# Patient Record
Sex: Female | Born: 1965
Health system: Southern US, Community
[De-identification: ages and names within clinical notes are randomized; demographics above are authoritative.]

## PROBLEM LIST (undated history)

## (undated) DIAGNOSIS — E669 Obesity, unspecified: Secondary | ICD-10-CM

## (undated) DIAGNOSIS — K649 Unspecified hemorrhoids: Secondary | ICD-10-CM

## (undated) DIAGNOSIS — I10 Essential (primary) hypertension: Secondary | ICD-10-CM

## (undated) DIAGNOSIS — G43909 Migraine, unspecified, not intractable, without status migrainosus: Secondary | ICD-10-CM

## (undated) HISTORY — DX: Essential (primary) hypertension: I10

## (undated) HISTORY — DX: Migraine, unspecified, not intractable, without status migrainosus: G43.909

## (undated) HISTORY — DX: Obesity, unspecified: E66.9

## (undated) HISTORY — DX: Unspecified hemorrhoids: K64.9

---

## 2016-07-05 HISTORY — PX: BREAST BIOPSY: SHX20

## 2019-02-06 DIAGNOSIS — Z7189 Other specified counseling: Secondary | ICD-10-CM | POA: Diagnosis not present

## 2019-02-06 DIAGNOSIS — Z20828 Contact with and (suspected) exposure to other viral communicable diseases: Secondary | ICD-10-CM | POA: Diagnosis not present

## 2019-03-29 DIAGNOSIS — Z23 Encounter for immunization: Secondary | ICD-10-CM | POA: Diagnosis not present

## 2019-04-20 DIAGNOSIS — Z7189 Other specified counseling: Secondary | ICD-10-CM | POA: Diagnosis not present

## 2019-04-20 DIAGNOSIS — Z20828 Contact with and (suspected) exposure to other viral communicable diseases: Secondary | ICD-10-CM | POA: Diagnosis not present

## 2019-04-21 DIAGNOSIS — Z03818 Encounter for observation for suspected exposure to other biological agents ruled out: Secondary | ICD-10-CM | POA: Diagnosis not present

## 2019-05-11 DIAGNOSIS — Z20828 Contact with and (suspected) exposure to other viral communicable diseases: Secondary | ICD-10-CM | POA: Diagnosis not present

## 2019-05-11 DIAGNOSIS — Z7189 Other specified counseling: Secondary | ICD-10-CM | POA: Diagnosis not present

## 2019-09-07 ENCOUNTER — Other Ambulatory Visit: Payer: Self-pay | Admitting: Surgery

## 2019-09-18 ENCOUNTER — Ambulatory Visit (HOSPITAL_COMMUNITY)
Admission: RE | Admit: 2019-09-18 | Discharge: 2019-09-18 | Disposition: A | Discharge status: Home / Self Care | Payer: BC Managed Care – PPO | Source: Ambulatory Visit | Attending: Surgery | Admitting: Surgery

## 2019-09-18 ENCOUNTER — Other Ambulatory Visit: Payer: Self-pay

## 2019-09-18 DIAGNOSIS — Z9884 Bariatric surgery status: Secondary | ICD-10-CM | POA: Diagnosis not present

## 2019-09-18 DIAGNOSIS — Z01818 Encounter for other preprocedural examination: Secondary | ICD-10-CM | POA: Diagnosis not present

## 2019-10-04 ENCOUNTER — Other Ambulatory Visit: Payer: Self-pay

## 2019-10-04 ENCOUNTER — Encounter: Payer: Self-pay | Admitting: Dietician

## 2019-10-04 ENCOUNTER — Encounter: Payer: BC Managed Care – PPO | Attending: Surgery | Admitting: Dietician

## 2019-10-04 DIAGNOSIS — E669 Obesity, unspecified: Secondary | ICD-10-CM

## 2019-10-04 NOTE — Patient Instructions (Signed)
Begin working through the [employer] discussed today.

## 2019-10-04 NOTE — Progress Notes (Signed)
Nutrition Assessment for Bariatric Surgery Medical Nutrition Therapy   Patient was seen on 10/04/2019 for Pre-Operative Nutrition Assessment. Letter of approval faxed to [facility] Surgery bariatric surgery program coordinator on 10/04/2019.   Referral stated Supervised Weight Loss (SWL) visits needed: 0  Planned surgery: Sleeve Gastrectomy  Pt expectation of surgery: to be thinner, feeling more like self again, looking forward to clothes shopping, more confidence, more energy    NUTRITION ASSESSMENT   Anthropometrics  Start weight at [facility]: 265 lbs (date: 10/04/2019) Height: 68 in BMI: 40 kg/m2     Lifestyle & Dietary Hx Typical meal pattern is 2 meals per day plus snacks, usually skips lunch. Avoids sodas, sugar, and red meat. Typically eats fish, chicken, [place] or pork with salad or vegetables. May snack on protein bars, baked chips, or rice cakes.   24-Hr Dietary Recall First Meal: breakfast sandwich (keto bread + cheese + egg + lean ham)   Snack: baked chips  Second Meal: - Snack: rice cakes Third Meal: tilapia + salad  Snack: - Beverages: water, sugar-free sparkling water, coffee (3 cups/day)   NUTRITION DIAGNOSIS  Overweight/obesity ([place]-3.3) related to past poor dietary habits and physical inactivity as evidenced by patient w/ planned Sleeve Gastrectomy surgery following dietary guidelines for continued weight loss.    NUTRITION INTERVENTION  Nutrition counseling (C-1) and education (E-2) to facilitate bariatric surgery goals.  Pre-Op Goals Reviewed with the Patient . Track food and beverage intake (pen and paper, MyFitness Pal, Baritastic app, etc.) . Make healthy food choices while monitoring portion sizes . Consume 3 meals per day or try to eat every 3-5 hours . Avoid concentrated sugars and fried foods . Keep sugar & fat in the single digits per serving on food labels . Practice CHEWING your food (aim for applesauce consistency) . Practice not drinking 15  minutes before, during, and 30 minutes after each meal and snack . Avoid all carbonated beverages (ex: soda, sparkling beverages)  . Limit caffeinated beverages (ex: coffee, tea, energy drinks) . Avoid all sugar-sweetened beverages (ex: regular soda, sports drinks)  . Avoid alcohol  . Aim for 64-100 ounces of FLUID daily (with at least half of fluid intake being plain water)  . Aim for at least 60-80 grams of PROTEIN daily . Look for a liquid protein source that contains ?15 g protein and ?5 g carbohydrate (ex: shakes, drinks, shots) . Make a list of non-food related activities . Physical activity is an important part of a healthy lifestyle so keep it moving! The goal is to reach 150 minutes of exercise per week, including cardiovascular and weight baring activity.  Handouts Provided Include  . Bariatric Surgery handouts (Nutrition Visits, Pre-Op Goals, Protein Shakes, Vitamins & Minerals)  Learning Style & Readiness for Change Teaching method utilized: Visual & Auditory  Demonstrated degree of understanding via: Teach Back  Barriers to learning/adherence to lifestyle change: None Identified    MONITORING & EVALUATION Dietary intake, weekly physical activity, body weight, and pre-op goals reached at next nutrition visit.   Next Steps Patient is to follow up at [facility] for Pre-Op Class (>2 weeks before surgery) for further nutrition education.

## 2019-10-29 ENCOUNTER — Ambulatory Visit: Payer: BC Managed Care – PPO

## 2019-11-07 ENCOUNTER — Ambulatory Visit (INDEPENDENT_AMBULATORY_CARE_PROVIDER_SITE_OTHER): Payer: BC Managed Care – PPO | Admitting: Psychology

## 2019-11-07 DIAGNOSIS — F509 Eating disorder, unspecified: Secondary | ICD-10-CM

## 2019-11-19 ENCOUNTER — Ambulatory Visit (INDEPENDENT_AMBULATORY_CARE_PROVIDER_SITE_OTHER): Payer: BC Managed Care – PPO | Admitting: Psychology

## 2019-11-19 DIAGNOSIS — F509 Eating disorder, unspecified: Secondary | ICD-10-CM | POA: Diagnosis not present

## 2019-11-26 ENCOUNTER — Encounter: Payer: BC Managed Care – PPO | Attending: Surgery | Admitting: Skilled Nursing Facility1

## 2019-11-26 ENCOUNTER — Other Ambulatory Visit: Payer: Self-pay

## 2019-11-26 DIAGNOSIS — E669 Obesity, unspecified: Secondary | ICD-10-CM | POA: Diagnosis not present

## 2019-11-26 NOTE — Progress Notes (Signed)
Pre-Operative Nutrition Class:  Appt start time: 4132   End time:  1830.  Patient was seen on 11/26/2019 for Pre-Operative Bariatric Surgery Education [facility]    Surgery date:  Surgery type: sleeve Start weight at [facility]: 265 Weight today: 272.5  Samples given per MNT protocol. Patient educated on appropriate usage: CelebrateMultivitamin Lot #[id number] Exp:10/2020  celebrateCalcium  Lot #[id number] st Exp:02/2021  premierProtein Shake Lot #[id number] Exp:03/02/20  The following the learning objectives were met by the patient during this course:  Identify Pre-Op Dietary Goals and will begin 2 weeks pre-operatively  Identify appropriate sources of fluids and proteins   State protein recommendations and appropriate sources pre and post-operatively  Identify Post-Operative Dietary Goals and will follow for 2 weeks post-operatively  Identify appropriate multivitamin and calcium sources  Describe the need for physical activity post-operatively and will follow MD recommendations  State when to call healthcare provider regarding medication questions or post-operative complications  Handouts given during class include:  Pre-Op Bariatric Surgery Diet Handout  Protein Shake Handout  Post-Op Bariatric Surgery Nutrition Handout  BELT Program Information Flyer  Support Group Information Flyer  [facility] Bariatric Supplements Price List  Follow-Up Plan: Patient will follow-up at [facility] 2 weeks post operatively for diet advancement per MD.

## 2020-04-07 DIAGNOSIS — Z03818 Encounter for observation for suspected exposure to other biological agents ruled out: Secondary | ICD-10-CM | POA: Diagnosis not present

## 2020-05-02 DIAGNOSIS — Z03818 Encounter for observation for suspected exposure to other biological agents ruled out: Secondary | ICD-10-CM | POA: Diagnosis not present

## 2020-05-21 DIAGNOSIS — Z23 Encounter for immunization: Secondary | ICD-10-CM | POA: Diagnosis not present

## 2020-06-03 DIAGNOSIS — Z03818 Encounter for observation for suspected exposure to other biological agents ruled out: Secondary | ICD-10-CM | POA: Diagnosis not present

## 2020-06-05 DIAGNOSIS — Z23 Encounter for immunization: Secondary | ICD-10-CM | POA: Diagnosis not present

## 2020-06-10 ENCOUNTER — Other Ambulatory Visit: Payer: Self-pay | Admitting: Family Medicine

## 2020-06-10 ENCOUNTER — Other Ambulatory Visit (HOSPITAL_COMMUNITY)
Admission: RE | Admit: 2020-06-10 | Discharge: 2020-06-10 | Disposition: A | Discharge status: Home / Self Care | Payer: BC Managed Care – PPO | Source: Ambulatory Visit | Attending: Family Medicine | Admitting: Family Medicine

## 2020-06-10 DIAGNOSIS — Z13220 Encounter for screening for lipoid disorders: Secondary | ICD-10-CM | POA: Diagnosis not present

## 2020-06-10 DIAGNOSIS — Z0000 Encounter for general adult medical examination without abnormal findings: Secondary | ICD-10-CM | POA: Insufficient documentation

## 2020-06-10 DIAGNOSIS — Z23 Encounter for immunization: Secondary | ICD-10-CM | POA: Diagnosis not present

## 2020-06-12 LAB — CYTOLOGY - PAP
Comment: NEGATIVE
Diagnosis: NEGATIVE
High risk HPV: NEGATIVE

## 2020-07-07 DIAGNOSIS — Z03818 Encounter for observation for suspected exposure to other biological agents ruled out: Secondary | ICD-10-CM | POA: Diagnosis not present

## 2020-07-07 DIAGNOSIS — U071 COVID-19: Secondary | ICD-10-CM | POA: Diagnosis not present

## 2020-12-03 DIAGNOSIS — D122 Benign neoplasm of ascending colon: Secondary | ICD-10-CM | POA: Diagnosis not present

## 2020-12-03 DIAGNOSIS — Z1211 Encounter for screening for malignant neoplasm of colon: Secondary | ICD-10-CM | POA: Diagnosis not present

## 2020-12-03 DIAGNOSIS — D123 Benign neoplasm of transverse colon: Secondary | ICD-10-CM | POA: Diagnosis not present

## 2020-12-03 DIAGNOSIS — K5730 Diverticulosis of large intestine without perforation or abscess without bleeding: Secondary | ICD-10-CM | POA: Diagnosis not present

## 2021-02-24 IMAGING — DX DG CHEST 2V
2 series · 2 of 2 positions shown · non-contrast
Comparison: None.

CLINICAL DATA: Morbid obesity. Pre-op evaluation for bariatric
surgery.

EXAM:
CHEST - 2 VIEW

[chest pa]
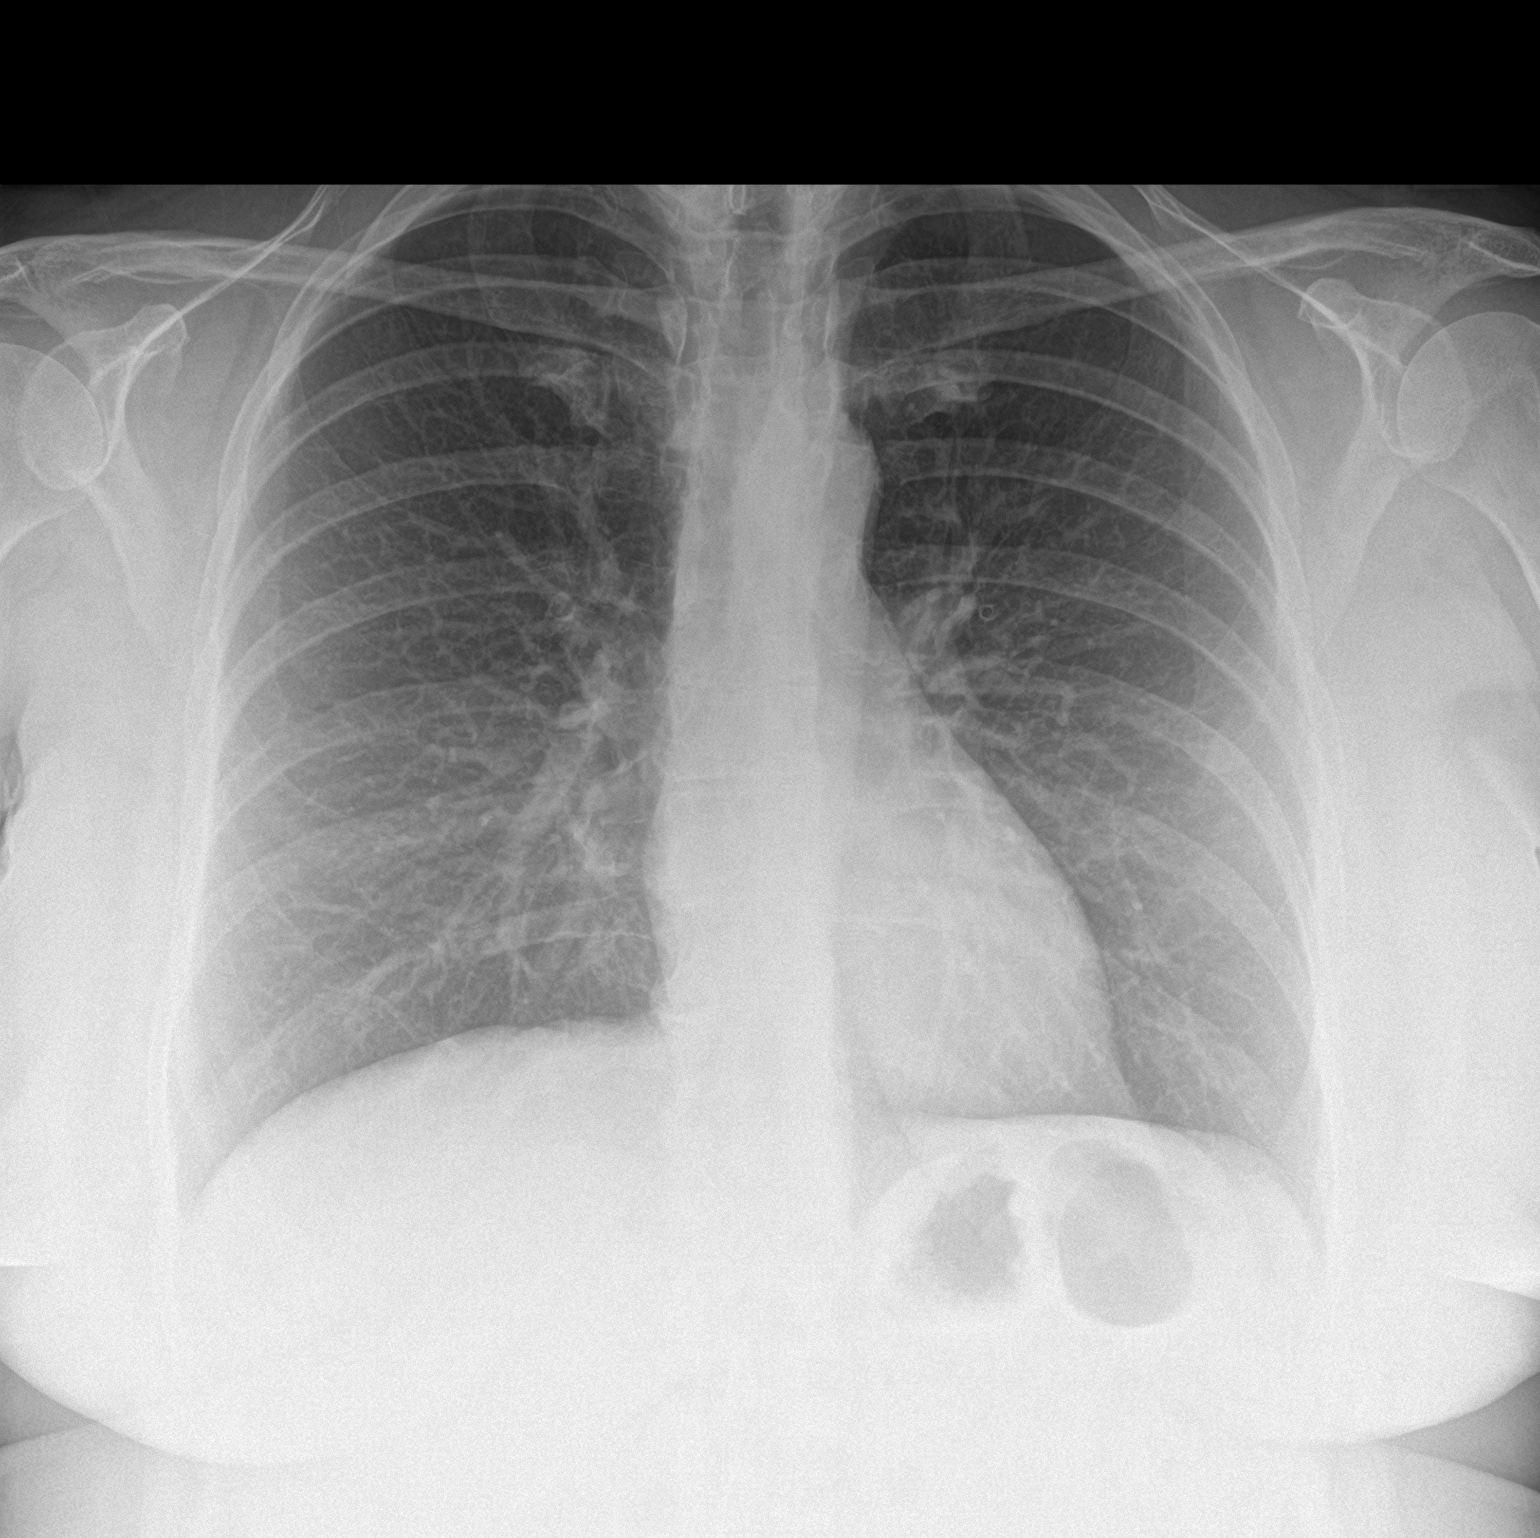

[chest lat]
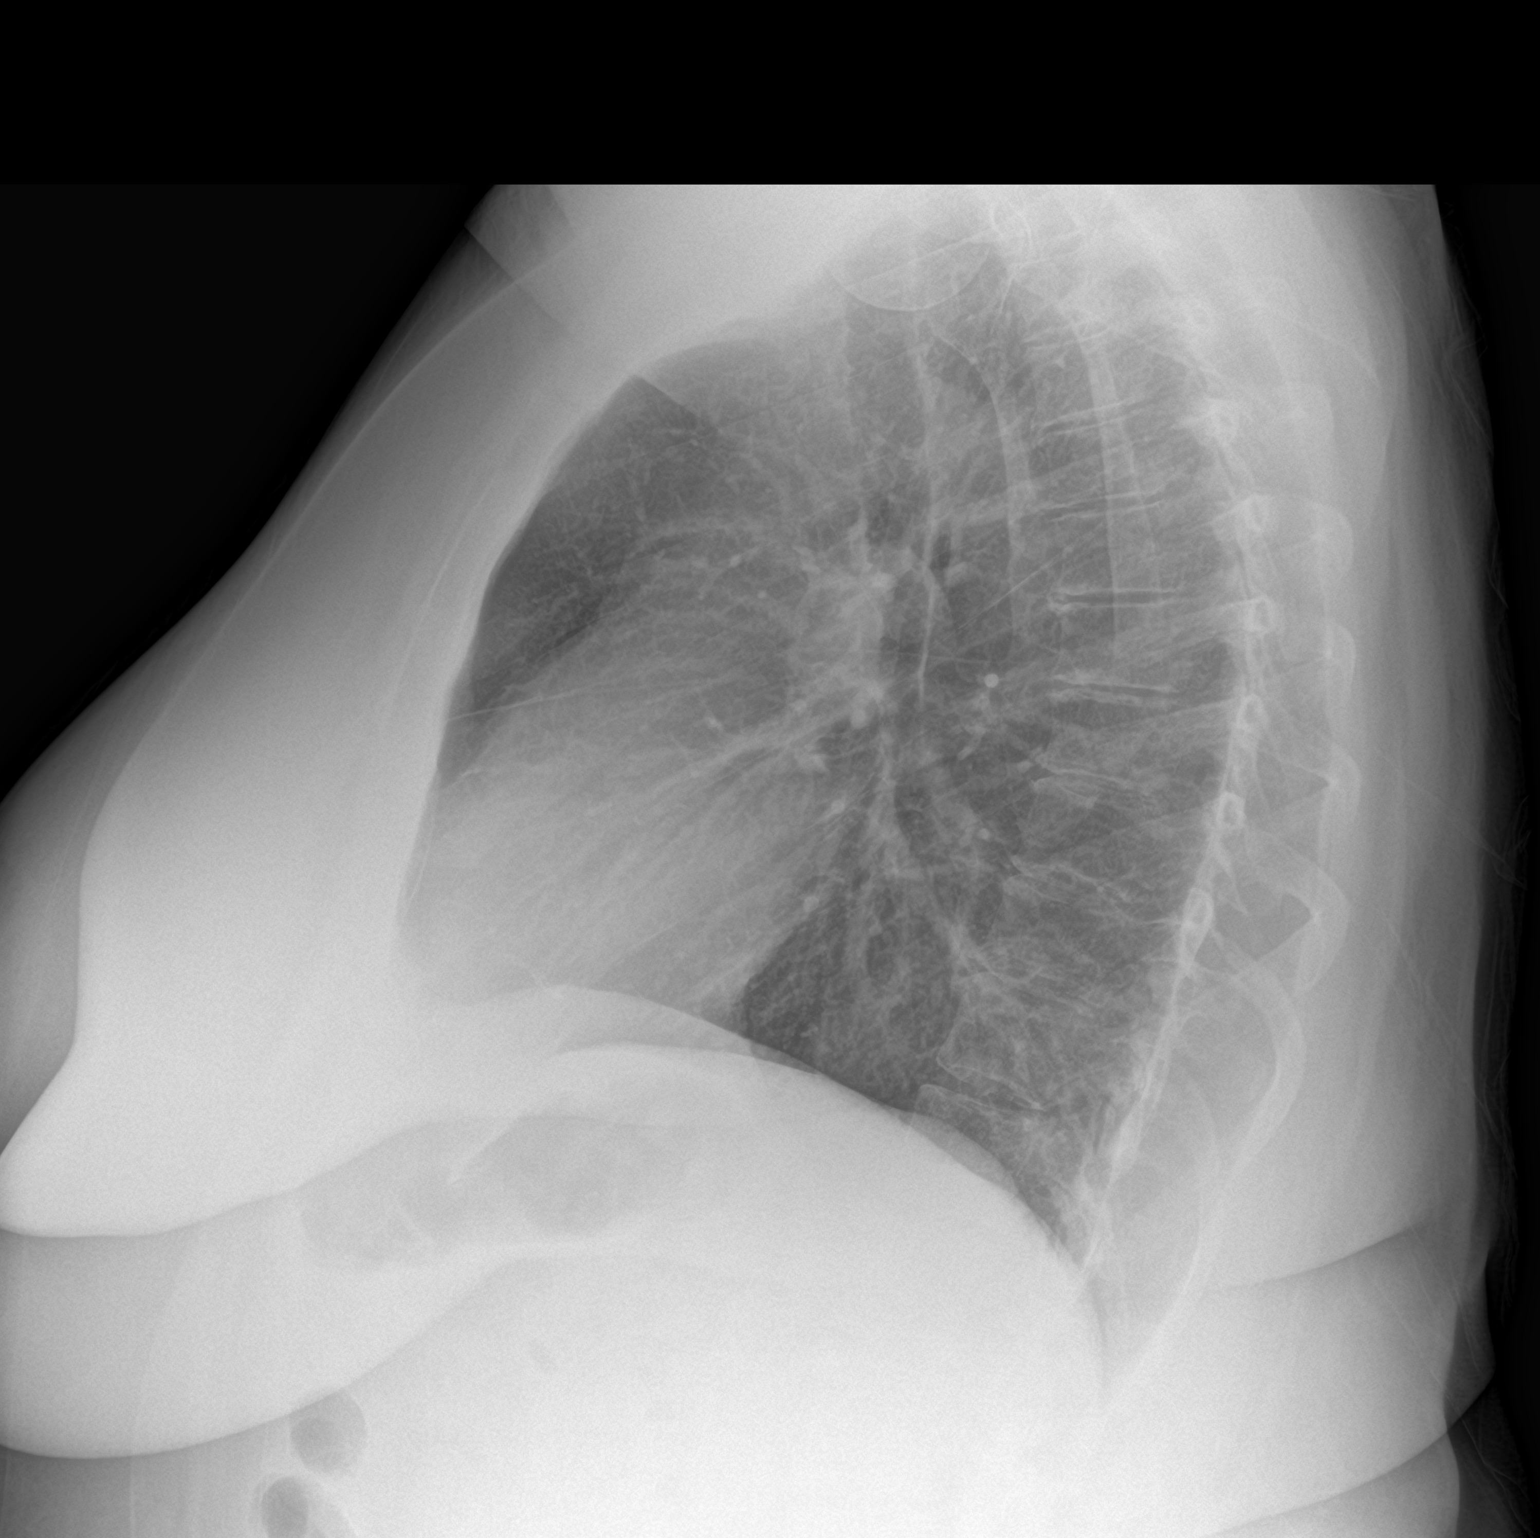

[2 of 2 positions shown; findings below may reference images not displayed]

FINDINGS: The heart size and mediastinal contours are within normal limits.
Both lungs are clear. The visualized skeletal structures are
unremarkable.
IMPRESSION: No active cardiopulmonary disease.

## 2021-02-24 IMAGING — RF DG UGI W SINGLE CM
12 series · 12 of 12 positions shown · non-contrast
Comparison: None.

CLINICAL DATA: Preop bariatric surgery

EXAM:
UPPER GI SERIES WITH KUB
TECHNIQUE: After obtaining a scout radiograph a routine upper GI series was
performed using thin barium
FLUOROSCOPY TIME:  Fluoroscopy Time:  1 minutes 2 seconds
Radiation Exposure Index (if provided by the fluoroscopic device):
55.5 mGy
Number of Acquired Spot Images: 0

[Series 1: t abdomen supine · 0.15mm/px · 1 of 1 slices shown (1 of 2)]
[im 1/1]
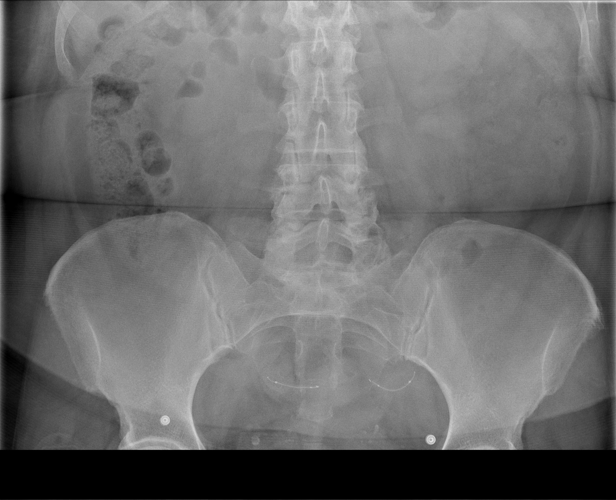

[Series 2: t abdomen supine · 0.15mm/px · 1 of 1 slices shown (2 of 2)]
[im 1/1]
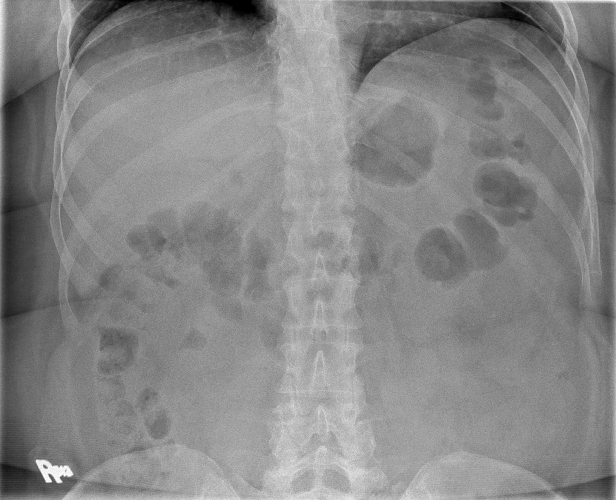

[Series 3: cp_standard · 0.26mm/px · 1 of 1 slices shown (1 of 10)]
[im 1/1]
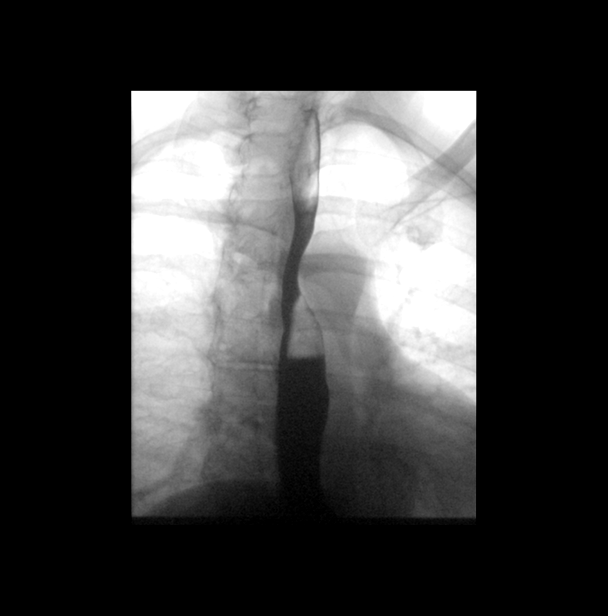

[Series 4: cp_standard · 0.26mm/px · 1 of 1 slices shown (2 of 10)]
[im 1/1]
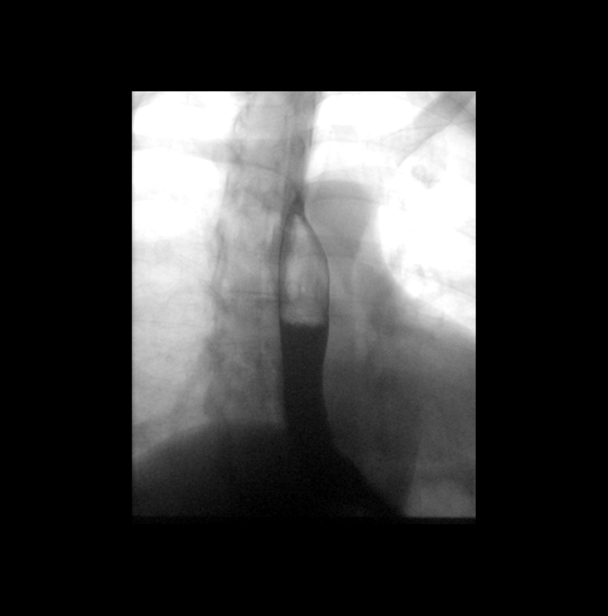

[Series 5: cp_standard · 0.27mm/px · 1 of 1 slices shown (3 of 10)]
[im 1/1]
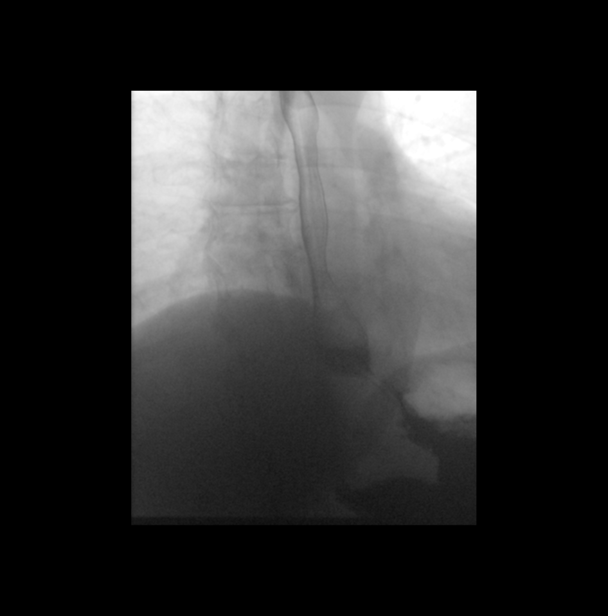

[Series 6: cp_standard · 0.27mm/px · 1 of 1 slices shown (4 of 10)]
[im 1/1]
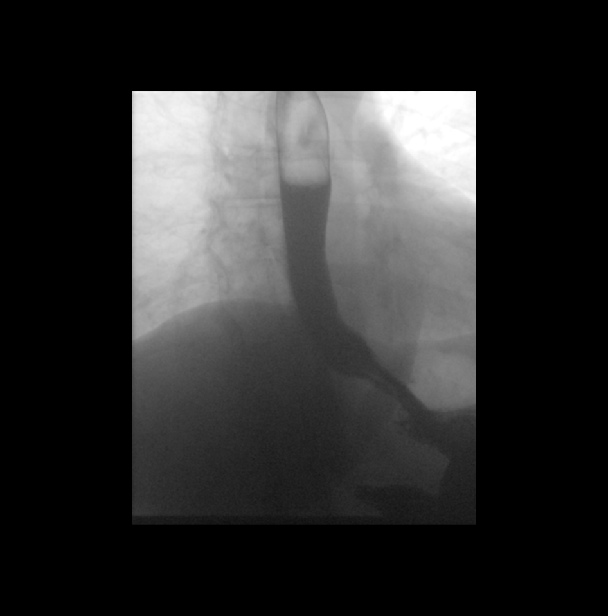

[Series 7: cp_standard · 0.27mm/px · 1 of 1 slices shown (5 of 10)]
[im 1/1]
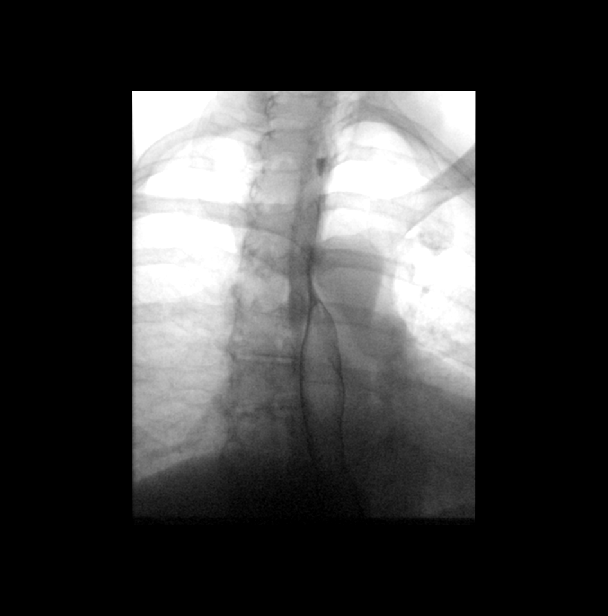

[Series 8: cp_standard · 0.18mm/px · 1 of 1 slices shown (6 of 10)]
[im 1/1]
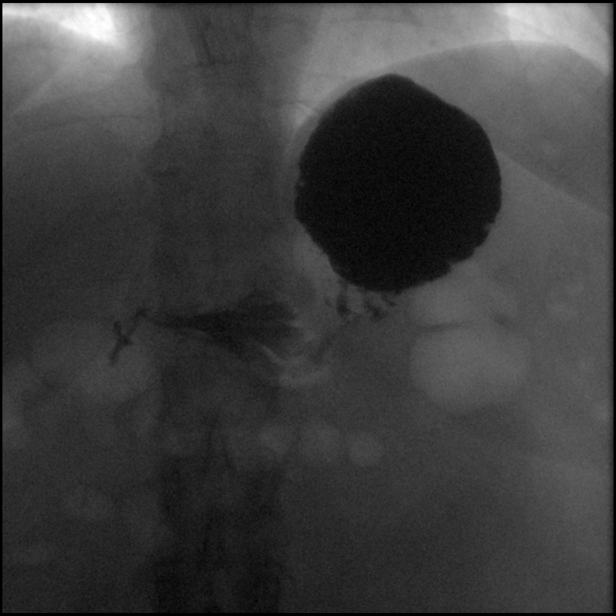

[Series 9: cp_standard · 0.17mm/px · 1 of 1 slices shown (7 of 10)]
[im 1/1]
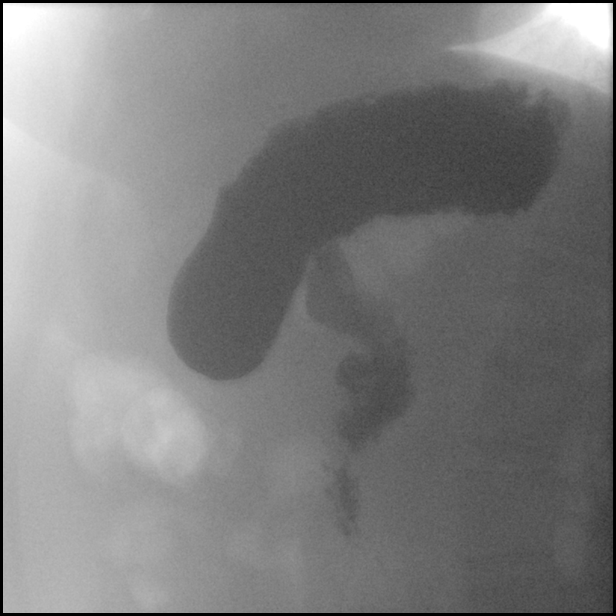

[Series 10: cp_standard · 0.17mm/px · 1 of 1 slices shown (8 of 10)]
[im 1/1]
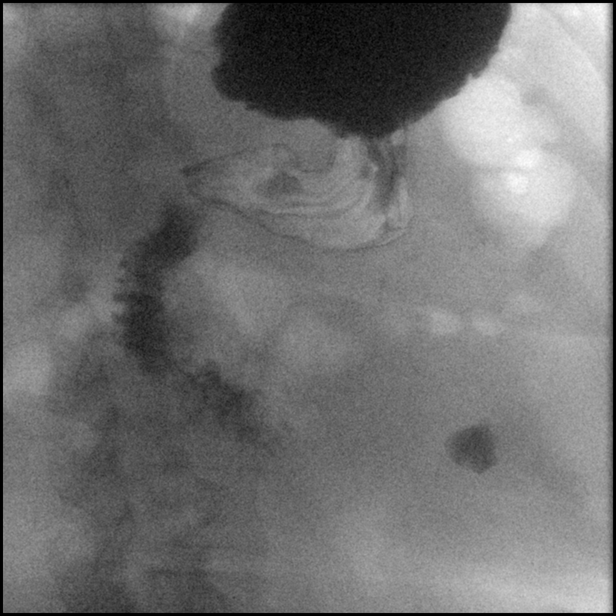

[Series 11: cp_standard · 0.17mm/px · 1 of 1 slices shown (9 of 10)]
[im 1/1]
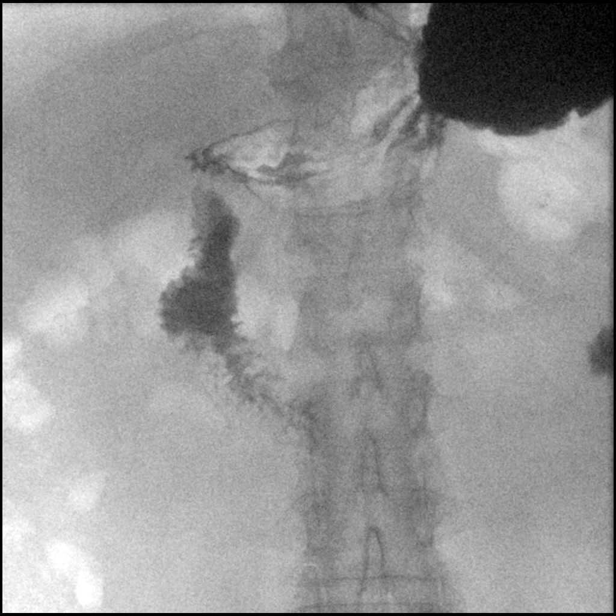

[Series 12: cp_standard · 0.17mm/px · 1 of 1 slices shown (10 of 10)]
[im 1/1]
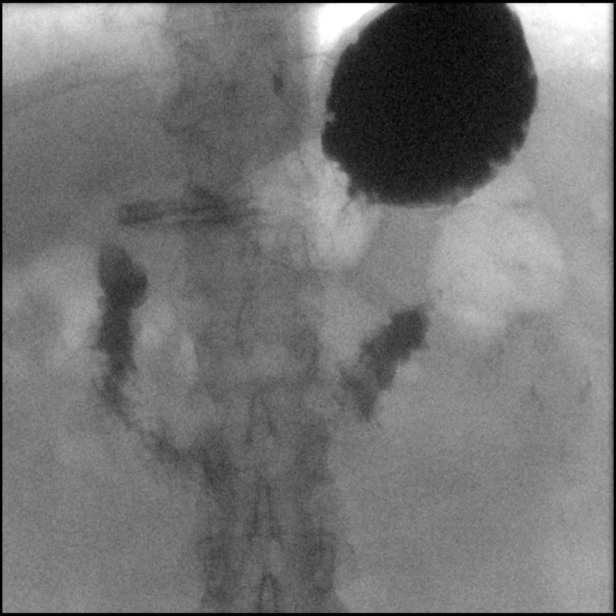

[12 of 12 positions shown; findings below may reference images not displayed]

FINDINGS: The scout radiograph is unremarkable. There is a nonobstructive
bowel gas pattern.

The esophagus is patent. No stricture or mass identified. The
motility of the esophagus appears normal.

The stomach, duodenal bulb and C loop have a normal configuration.

No hiatal hernia identified. No reflux.
IMPRESSION: Normal exam.

## 2021-04-22 DIAGNOSIS — Z23 Encounter for immunization: Secondary | ICD-10-CM | POA: Diagnosis not present

## 2021-06-19 DIAGNOSIS — Z13220 Encounter for screening for lipoid disorders: Secondary | ICD-10-CM | POA: Diagnosis not present

## 2021-06-19 DIAGNOSIS — E669 Obesity, unspecified: Secondary | ICD-10-CM | POA: Diagnosis not present

## 2021-06-19 DIAGNOSIS — Z0000 Encounter for general adult medical examination without abnormal findings: Secondary | ICD-10-CM | POA: Diagnosis not present

## 2021-06-19 DIAGNOSIS — Z6831 Body mass index (BMI) 31.0-31.9, adult: Secondary | ICD-10-CM | POA: Diagnosis not present

## 2021-10-27 ENCOUNTER — Other Ambulatory Visit: Payer: Self-pay | Admitting: Family Medicine

## 2021-10-27 DIAGNOSIS — Z1231 Encounter for screening mammogram for malignant neoplasm of breast: Secondary | ICD-10-CM

## 2021-10-30 ENCOUNTER — Ambulatory Visit
Admission: RE | Admit: 2021-10-30 | Discharge: 2021-10-30 | Disposition: A | Discharge status: Home / Self Care | Payer: BC Managed Care – PPO | Source: Ambulatory Visit

## 2021-10-30 DIAGNOSIS — Z1231 Encounter for screening mammogram for malignant neoplasm of breast: Secondary | ICD-10-CM

## 2021-12-04 DIAGNOSIS — M25511 Pain in right shoulder: Secondary | ICD-10-CM | POA: Diagnosis not present

## 2021-12-04 DIAGNOSIS — M546 Pain in thoracic spine: Secondary | ICD-10-CM | POA: Diagnosis not present

## 2022-03-23 DIAGNOSIS — Z0000 Encounter for general adult medical examination without abnormal findings: Secondary | ICD-10-CM | POA: Diagnosis not present

## 2022-03-23 DIAGNOSIS — Z13220 Encounter for screening for lipoid disorders: Secondary | ICD-10-CM | POA: Diagnosis not present

## 2022-04-14 DIAGNOSIS — Z23 Encounter for immunization: Secondary | ICD-10-CM | POA: Diagnosis not present

## 2022-07-06 DIAGNOSIS — Z111 Encounter for screening for respiratory tuberculosis: Secondary | ICD-10-CM | POA: Diagnosis not present
